# Patient Record
Sex: Female | Born: 2005
Health system: Southern US, Community
[De-identification: ages and names within clinical notes are randomized; demographics above are authoritative.]

## PROBLEM LIST (undated history)

## (undated) DIAGNOSIS — G43909 Migraine, unspecified, not intractable, without status migrainosus: Secondary | ICD-10-CM

## (undated) HISTORY — DX: Migraine, unspecified, not intractable, without status migrainosus: G43.909

---
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
## 2025-01-24 ENCOUNTER — Ambulatory Visit: Admitting: Radiology

## 2025-01-24 ENCOUNTER — Ambulatory Visit: Admitting: Nurse Practitioner

## 2025-03-20 ENCOUNTER — Ambulatory Visit: Admitting: Obstetrics and Gynecology
